# Patient Record
Sex: Male | Born: 1990 | Race: Black or African American | Hispanic: No | Marital: Single | Smoking: Never smoker
Health system: Southern US, Community
[De-identification: ages and names within clinical notes are randomized; demographics above are authoritative.]

## PROBLEM LIST (undated history)

## (undated) HISTORY — PX: TYMPANOSTOMY TUBE PLACEMENT: SHX32

---
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
## 2015-08-21 ENCOUNTER — Emergency Department (HOSPITAL_COMMUNITY)
Admission: EM | Admit: 2015-08-21 | Discharge: 2015-08-21 | Disposition: A | Discharge status: Home / Self Care | Payer: 59 | Attending: Emergency Medicine | Admitting: Emergency Medicine

## 2015-08-21 ENCOUNTER — Encounter (HOSPITAL_COMMUNITY): Payer: Self-pay | Admitting: *Deleted

## 2015-08-21 ENCOUNTER — Emergency Department (HOSPITAL_COMMUNITY): Payer: 59

## 2015-08-21 DIAGNOSIS — Y9372 Activity, wrestling: Secondary | ICD-10-CM | POA: Diagnosis not present

## 2015-08-21 DIAGNOSIS — Y999 Unspecified external cause status: Secondary | ICD-10-CM | POA: Insufficient documentation

## 2015-08-21 DIAGNOSIS — Y929 Unspecified place or not applicable: Secondary | ICD-10-CM | POA: Insufficient documentation

## 2015-08-21 DIAGNOSIS — T1490XA Injury, unspecified, initial encounter: Secondary | ICD-10-CM

## 2015-08-21 DIAGNOSIS — S3992XA Unspecified injury of lower back, initial encounter: Secondary | ICD-10-CM | POA: Diagnosis present

## 2015-08-21 DIAGNOSIS — S300XXA Contusion of lower back and pelvis, initial encounter: Secondary | ICD-10-CM | POA: Insufficient documentation

## 2015-08-21 DIAGNOSIS — W228XXA Striking against or struck by other objects, initial encounter: Secondary | ICD-10-CM | POA: Diagnosis not present

## 2015-08-21 MED ORDER — CYCLOBENZAPRINE HCL 10 MG PO TABS: 10.0000 mg | ORAL_TABLET | Freq: Two times a day (BID) | ORAL | 0 refills | Status: DC | PRN

## 2015-08-21 MED ORDER — IBUPROFEN 800 MG PO TABS: 800.0000 mg | ORAL_TABLET | Freq: Three times a day (TID) | ORAL | 0 refills | Status: DC

## 2015-08-21 NOTE — ED Triage Notes (Signed)
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 Pt reports lower back pain to tail bone.

## 2015-08-21 NOTE — ED Notes (Signed)
Declined W/C at D/C and was escorted to lobby by RN. 

## 2015-08-21 NOTE — ED Provider Notes (Signed)
[facility]-EMERGENCY DEPT Provider Note   CSN: [id number] Arrival date & time: 08/21/15  [id number]  First Provider Contact:  None       History   Chief Complaint Chief Complaint  Patient presents with  . Back Pain    HPI Allen Branch is a 25 y.o. male.  HPI   25 year old male presents for evaluation of low back pain. 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 He denies any significant pain at that time, was able to continue with this for routine but afterward he complains of sharp achy pain to his buttock most specifically at the coccyx region. Pain is more intense with ambulating but currently rates as 4 out of 10 while sitting and laying. He did take 2 ibuprofen early this morning which provided some relief. He denies any associated fever, rectal pain, blood per rectum, leg pain, radicular pain, new numbness weakness, bowel bladder incontinence or saddle anesthesia. he initially thought it was low back pain but after massaging his back he felt the pain is located in the coccyx region. He denies any prior injury to that same area.  History reviewed. No pertinent past medical history.  There are no active problems to display for this patient.   History reviewed. No pertinent surgical history.     Home Medications    Prior to Admission medications   Not on File    Family History History reviewed. No pertinent family history.  Social History Social History  Substance Use Topics  . Smoking status: Never Smoker  . Smokeless tobacco: Never Used  . Alcohol use No     Allergies   Review of patient's allergies indicates no known allergies.   Review of Systems Review of Systems  Constitutional: Negative for fever.  Gastrointestinal: Negative for blood in stool and rectal pain.  Musculoskeletal: Negative for back pain.  Neurological: Negative for numbness.      Physical Exam Updated Vital Signs BP 124/63 (BP Location: Left Arm)   Pulse 67   Temp 98.2 F (36.8 C) (Oral)   Resp 20   Ht 6' (1.829 m)   Wt 97.5 kg   SpO2 98%   BMI 29.16 kg/m   Physical Exam  Constitutional: He appears well-developed and well-nourished. No distress.  HENT:  Head: Atraumatic.  Eyes: Conjunctivae are normal.  Neck: Normal range of motion. Neck supple.  Cardiovascular: Intact distal pulses.   Musculoskeletal: He exhibits tenderness (Tenderness to the sacrum and coccyx region on palpation without any obvious deformity. No overlying skin bruises.).  No significant midline spine tenderness crepitus or step-off.  Neurological: He is alert.  Patellar deep tendon reflex intact bilaterally, no foot drops, able to ambulate  Skin: No rash noted.  Psychiatric: He has a normal mood and affect.     ED Treatments / Results  Labs (all labs ordered are listed, but only abnormal results are displayed) Labs Reviewed - No data to display  EKG  EKG Interpretation None       Radiology Dg Sacrum/coccyx  Result Date: 08/21/2015 CLINICAL DATA:  Fall last night with pain in the sacrococcygeal region. EXAM: SACRUM AND COCCYX - 2+ VIEW COMPARISON:  None. FINDINGS: The arcuate lines appear continuous. Articular space in the hips preserved. No cortical discontinuity in the pelvic ring identified. No sacral or coccygeal fracture is observed on today's conventional radiographs. IMPRESSION: 1. No acute bony  findings. If there is a high clinical suspicion of occult fracture, CT or MRI could be utilized for further characterization. Electronically Signed   By: Gaylyn Rong M.D.   On: 08/21/2015 08:26    Procedures Procedures (including critical care time)  Medications Ordered in ED Medications - No data to display   Initial Impression / Assessment and Plan / ED Course  I have reviewed the triage vital signs and the nursing notes.  Pertinent labs & imaging  results that were available during my care of the patient were reviewed by me and considered in my medical decision making (see chart for details).  Clinical Course    BP 124/63 (BP Location: Left Arm)   Pulse 67   Temp 98.2 F (36.8 C) (Oral)   Resp 20   Ht 6' (1.829 m)   Wt 97.5 kg   SpO2 98%   BMI 29.16 kg/m    Final Clinical Impressions(s) / ED Diagnoses   Final diagnoses:  Injury  Coccygeal contusion, initial encounter    New Prescriptions New Prescriptions   No medications on file   7:53 AM Patient presents with mechanical injury to his buttock region after wrestling. He is neurovascularly intact, able to ambulate, and has minimal reproducible pain. X-ray ordered to rule out fractures or dislocation.  8:42 AM Sacrum and coccyx x-ray without acute fracture. Suspect coccygeal contusion. Recommend patient to sit on a donut pillow for comfort, rice therapy discussed. NSAIDs and muscle relaxant prescribed. Orthopedic referral given as needed.   Fayrene Helper, PA-C 08/22/15 1610    Derwood Kaplan, MD 09/01/15 [phone]

## 2015-12-19 ENCOUNTER — Emergency Department
Admission: EM | Admit: 2015-12-19 | Discharge: 2015-12-19 | Disposition: A | Discharge status: Home / Self Care | Payer: 59 | Attending: Emergency Medicine | Admitting: Emergency Medicine

## 2015-12-19 ENCOUNTER — Encounter: Payer: Self-pay | Admitting: Emergency Medicine

## 2015-12-19 DIAGNOSIS — K602 Anal fissure, unspecified: Secondary | ICD-10-CM | POA: Insufficient documentation

## 2015-12-19 DIAGNOSIS — K6289 Other specified diseases of anus and rectum: Secondary | ICD-10-CM | POA: Diagnosis present

## 2015-12-19 DIAGNOSIS — Z791 Long term (current) use of non-steroidal anti-inflammatories (NSAID): Secondary | ICD-10-CM | POA: Insufficient documentation

## 2015-12-19 MED ORDER — HYDROCORTISONE ACE-PRAMOXINE 1-1 % RE FOAM
1.0000 | Freq: Two times a day (BID) | RECTAL | Status: DC
  Administered 2015-12-19: 1 via RECTAL
  Filled 2015-12-19: qty 10

## 2015-12-19 NOTE — ED Notes (Signed)
Discharge instructions reviewed with patient. Questions fielded by this RN. Patient verbalizes understanding of instructions. Patient discharged home in stable condition per Sung MD . No acute distress noted at time of discharge.   

## 2015-12-19 NOTE — ED Provider Notes (Signed)
[facility] Emergency Department Provider Note   ____________________________________________   First MD Initiated Contact with Patient 12/19/15 0401     (approximate)  I have reviewed the triage vital signs and the nursing notes.   HISTORY  Chief Complaint Rectal Pain    HPI Allen Branch is a 25 y.o. male who presents to the ED from home with a chief complaint of rectal pain. Patient noted pain to his rectum yesterday after straining to have bowel movements. He thinks he might be having a hemorrhoid. Denies associated fever, chills, chest pain, shortness of breath, abdominal pain, nausea, vomiting, rectal bleeding. Denies foreign body insertion. Denies recent travel trauma. Nothing makes his symptoms better or worse.   Past medical history None  There are no active problems to display for this patient.   Past Surgical History:  Procedure Laterality Date  . TYMPANOSTOMY TUBE PLACEMENT      Prior to Admission medications   Medication Sig Start Date End Date Taking? Authorizing Provider  cyclobenzaprine (FLEXERIL) 10 MG tablet Take 1 tablet (10 mg total) by mouth 2 (two) times daily as needed for muscle spasms. 08/21/15   Fayrene HelperBowie Tran, PA-C  ibuprofen (ADVIL,MOTRIN) 800 MG tablet Take 1 tablet (800 mg total) by mouth 3 (three) times daily. 08/21/15   Fayrene HelperBowie Tran, PA-C    Allergies Patient has no known allergies.  History reviewed. No pertinent family history.  Social History Social History  Substance Use Topics  . Smoking status: Never Smoker  . Smokeless tobacco: Never Used  . Alcohol use No    Review of Systems  Constitutional: No fever/chills. Eyes: No visual changes. ENT: No sore throat. Cardiovascular: Denies chest pain. Respiratory: Denies shortness of breath. Gastrointestinal: Positive for rectal pain. No abdominal pain.  No nausea, no vomiting.  No diarrhea.  No constipation. Genitourinary: Negative for dysuria. Musculoskeletal:  Negative for back pain. Skin: Negative for rash. Neurological: Negative for headaches, focal weakness or numbness.  10-point ROS otherwise negative.  ____________________________________________   PHYSICAL EXAM:  VITAL SIGNS: ED Triage Vitals [12/19/15 0100]  Enc Vitals Group     BP 139/86     Pulse Rate 86     Resp 18     Temp 98.3 F (36.8 C)     Temp Source Oral     SpO2 97 %     Weight 224 lb (101.6 kg)     Height 6' (1.829 m)     Head Circumference      Peak Flow      Pain Score 9     Pain Loc      Pain Edu?      Excl. in GC?     Constitutional: Alert and oriented. Well appearing and in no acute distress. Eyes: Conjunctivae are normal. PERRL. EOMI. Head: Atraumatic. Nose: No congestion/rhinnorhea. Mouth/Throat: Mucous membranes are moist.  Oropharynx non-erythematous. Neck: No stridor.   Cardiovascular: Normal rate, regular rhythm. Grossly normal heart sounds.  Good peripheral circulation. Respiratory: Normal respiratory effort.  No retractions. Lungs CTAB. Gastrointestinal: Soft and nontender to light or deep palpation.. No distention. No abdominal bruits. No CVA tenderness. Musculoskeletal: No lower extremity tenderness nor edema.  No joint effusions. Neurologic:  Normal speech and language. No gross focal neurologic deficits are appreciated. No gait instability. Skin:  Skin is warm, dry and intact. No rash noted. Psychiatric: Mood and affect are normal. Speech and behavior are normal.  ____________________________________________   LABS (all labs ordered are listed, but  only abnormal results are displayed)  Labs Reviewed - No data to display ____________________________________________  EKG  None ____________________________________________  RADIOLOGY  None ____________________________________________   PROCEDURES  Procedure(s) performed:   Rectal exam: External exam without hemorrhoids. + anal fissure. No warmth, erythema, fluctuance to  suggest perirectal abscess.  Procedures  Critical Care performed: No  ____________________________________________   INITIAL IMPRESSION / ASSESSMENT AND PLAN / ED COURSE  Pertinent labs & imaging results that were available during my care of the patient were reviewed by me and considered in my medical decision making (see chart for details).  25 year old male presenting with rectal pain secondary to anal fissure. Will apply Proctofoam, encouraged increasing fiber, using sitz baths, stool softeners and follow up with his PCP. Strict return precautions given. Patient verbalizes understanding and agrees with plan of care.  Clinical Course      ____________________________________________   FINAL CLINICAL IMPRESSION(S) / ED DIAGNOSES  Final diagnoses:  Anal fissure  Rectal pain      NEW MEDICATIONS STARTED DURING THIS VISIT:  New Prescriptions   No medications on file     Note:  This document was prepared using Dragon voice recognition software and may include unintentional dictation errors.    Irean HongJade J Sung, MD 12/19/15 [phone]

## 2015-12-19 NOTE — ED Triage Notes (Signed)
Pt presents to ED with c/o pain to his rectum since yesterday. Pt states he thinks he may have a hemorrhoid with no hx of the same. Pt denies bleeding but reports that he does have a bump at the affected area. Pt ambulatory with steady gait. No distress noted at this time.

## 2015-12-19 NOTE — Discharge Instructions (Signed)
1. Apply Proctofoam rectal cream twice daily as needed for rectal discomfort. 2. Use sitz baths several times daily. 3. Return to the ER for worsening symptoms, persistent vomiting, heavy bleeding or other concerns.

## 2016-11-06 ENCOUNTER — Emergency Department (HOSPITAL_COMMUNITY)
Admission: EM | Admit: 2016-11-06 | Discharge: 2016-11-07 | Disposition: A | Discharge status: Home / Self Care | Payer: BLUE CROSS/BLUE SHIELD | Attending: Emergency Medicine | Admitting: Emergency Medicine

## 2016-11-06 ENCOUNTER — Encounter (HOSPITAL_COMMUNITY): Payer: Self-pay | Admitting: *Deleted

## 2016-11-06 ENCOUNTER — Emergency Department (HOSPITAL_COMMUNITY): Payer: BLUE CROSS/BLUE SHIELD

## 2016-11-06 DIAGNOSIS — X500XXA Overexertion from strenuous movement or load, initial encounter: Secondary | ICD-10-CM | POA: Insufficient documentation

## 2016-11-06 DIAGNOSIS — M545 Low back pain, unspecified: Secondary | ICD-10-CM

## 2016-11-06 DIAGNOSIS — R1030 Lower abdominal pain, unspecified: Secondary | ICD-10-CM | POA: Insufficient documentation

## 2016-11-06 DIAGNOSIS — R1031 Right lower quadrant pain: Secondary | ICD-10-CM | POA: Diagnosis not present

## 2016-11-06 LAB — URINALYSIS, ROUTINE W REFLEX MICROSCOPIC
BILIRUBIN URINE: NEGATIVE
Glucose, UA: NEGATIVE mg/dL
HGB URINE DIPSTICK: NEGATIVE
KETONES UR: NEGATIVE mg/dL
Leukocytes, UA: NEGATIVE
NITRITE: NEGATIVE
PH: 6 (ref 5.0–8.0)
Protein, ur: NEGATIVE mg/dL
Specific Gravity, Urine: 1.016 (ref 1.005–1.030)

## 2016-11-06 LAB — CBC WITH DIFFERENTIAL/PLATELET
BASOS PCT: 0 %
Basophils Absolute: 0 10*3/uL (ref 0.0–0.1)
EOS ABS: 0.3 10*3/uL (ref 0.0–0.7)
Eosinophils Relative: 6 %
HEMATOCRIT: 43.3 % (ref 39.0–52.0)
Hemoglobin: 14.3 g/dL (ref 13.0–17.0)
LYMPHS ABS: 2 10*3/uL (ref 0.7–4.0)
Lymphocytes Relative: 40 %
MCH: 31 pg (ref 26.0–34.0)
MCHC: 33 g/dL (ref 30.0–36.0)
MCV: 93.9 fL (ref 78.0–100.0)
MONO ABS: 0.6 10*3/uL (ref 0.1–1.0)
MONOS PCT: 11 %
Neutro Abs: 2.2 10*3/uL (ref 1.7–7.7)
Neutrophils Relative %: 43 %
Platelets: 185 10*3/uL (ref 150–400)
RBC: 4.61 MIL/uL (ref 4.22–5.81)
RDW: 12.4 % (ref 11.5–15.5)
WBC: 5 10*3/uL (ref 4.0–10.5)

## 2016-11-06 LAB — COMPREHENSIVE METABOLIC PANEL
ALBUMIN: 4.1 g/dL (ref 3.5–5.0)
ALK PHOS: 60 U/L (ref 38–126)
ALT: 17 U/L (ref 17–63)
AST: 21 U/L (ref 15–41)
Anion gap: 8 (ref 5–15)
BUN: 13 mg/dL (ref 6–20)
CALCIUM: 9.2 mg/dL (ref 8.9–10.3)
CO2: 30 mmol/L (ref 22–32)
CREATININE: 1.16 mg/dL (ref 0.61–1.24)
Chloride: 102 mmol/L (ref 101–111)
GFR calc Af Amer: >60 mL/min (ref >60)
GFR calc non Af Amer: >60 mL/min (ref >60)
GLUCOSE: 86 mg/dL (ref 65–99)
Potassium: 4 mmol/L (ref 3.5–5.1)
SODIUM: 140 mmol/L (ref 135–145)
Total Bilirubin: 0.5 mg/dL (ref 0.3–1.2)
Total Protein: 7.5 g/dL (ref 6.5–8.1)

## 2016-11-06 LAB — CK: Total CK: 206 U/L (ref 49–397)

## 2016-11-06 NOTE — ED Triage Notes (Signed)
Pt reports lifting heavier weights that normal last week. Pt states he felt a "weird" feeling but continued to lift weights. Pt states he began having lower back pain, right buttocks pain, rlq pain, right groin pain.

## 2016-11-06 NOTE — ED Provider Notes (Signed)
[facility] EMERGENCY DEPARTMENT Provider Note   CSN: [id number] Arrival date & time: 11/06/16  2230  Time seen 23:05 PM   History   Chief Complaint Chief Complaint  Patient presents with  . Back Pain    HPI Allen Branch is a 26 y.o. male.  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  However he states October 18 he started doing a new type of lift that he had not done for many years.  He was dead lifting weights at 315 pounds.  He states he had immediate discomfort and he did a total of approximately 25 lifts.  He states you are supposed to use your legs to help lift however he was using his back to do the lifting.  He states in the past he has done up to 400 pounds but again that was many years ago.  He states he had immediate pulling and tightness feeling in his right lower  back and in his right buttock area.  He was awakened at 2 AM that night and had some numbness in his right back and his right right buttock.  He also states he felt like he was swollen in those areas.  He has had some intermittent dark urine but not on a regular consistent basis.  He states the following day he also started having right lower quadrant and right groin pain.  He states he either has pain in his groin or in his back and it rotates back and forth.  There is nothing he does that makes the pain hurt more, there is nothing he does makes it feel better.  He also states that the pain and the numbness are both improving.  He denies nausea or vomiting and states he is having a normal appetite.  He states he is able to walk normally however he is guarded because he is afraid of causing pain.  He denies any urinary or rectal incontinence.  His concern tonight is that he has appendicitis.  PCP none  History reviewed. No pertinent past medical history.  There are no active problems to display for this patient.   Past Surgical History:    Procedure Laterality Date  . TYMPANOSTOMY TUBE PLACEMENT         Home Medications    Prior to Admission medications   Medication Sig Start Date End Date Taking? Authorizing Provider  ibuprofen (ADVIL,MOTRIN) 200 MG tablet Take 200 mg by mouth every 6 (six) hours as needed for mild pain or moderate pain.   Yes [provider]  methocarbamol (ROBAXIN) 500 MG tablet Take 1 or 2 po Q 6hrs for muscle soreness or pain 11/07/16   Devoria Albe, MD  naproxen (NAPROSYN) 500 MG tablet Take 1 po BID with food prn pain 11/07/16   Devoria Albe, MD    Family History History reviewed. No pertinent family history.  Social History Social History  Substance Use Topics  . Smoking status: Never Smoker  . Smokeless tobacco: Never Used  . Alcohol use No   Professional wrestler  Allergies   Patient has no known allergies.   Review of Systems Review of Systems  All other systems reviewed and are negative.    Physical Exam Updated Vital Signs BP (!) 142/88   Pulse 75   Temp 98.9 F (37.2 C) (Oral)   Resp 16   Ht 6' (1.829 m)   Wt 97.1 kg (214  lb)   SpO2 100%   BMI 29.02 kg/m   Vital signs normal    Physical Exam  Constitutional: He is oriented to person, place, and time. He appears well-developed and well-nourished. No distress.  HENT:  Head: Normocephalic and atraumatic.  Right Ear: External ear normal.  Left Ear: External ear normal.  Nose: Nose normal.  Pt is laying on his right side, states he is almost falling asleep.   Eyes: Conjunctivae and EOM are normal.  Neck: Normal range of motion.  Cardiovascular: Normal rate.   Pulmonary/Chest: Effort normal. No respiratory distress.  Abdominal: Soft. Bowel sounds are normal. He exhibits no distension and no mass. There is tenderness. There is no rebound and no guarding. No hernia.    Mild tenderness  Musculoskeletal: Normal range of motion. He exhibits tenderness. He exhibits no edema or deformity.  Patient is  nontender in his thoracic or lumbar spine.  He has some mild tenderness of the right lateral paraspinous muscles of the lumbar spine.  He does not have pain in the sciatic notch on either side.  His patellar reflexes are 2+ and equal bilaterally, he has no pain on straight leg raising bilaterally.  Neurological: He is alert and oriented to person, place, and time. He displays normal reflexes. No cranial nerve deficit. Coordination normal.  Skin: Skin is warm and dry. No erythema.  Psychiatric: He has a normal mood and affect. His behavior is normal. Thought content normal.  Nursing note and vitals reviewed.    ED Treatments / Results  Labs (all labs ordered are listed, but only abnormal results are displayed) Results for orders placed or performed during the hospital encounter of 11/06/16  Comprehensive metabolic panel  Result Value Ref Range   Sodium 140 135 - 145 mmol/L   Potassium 4.0 3.5 - 5.1 mmol/L   Chloride 102 101 - 111 mmol/L   CO2 30 22 - 32 mmol/L   Glucose, Bld 86 65 - 99 mg/dL   BUN 13 6 - 20 mg/dL   Creatinine, Ser 4.091.16 0.61 - 1.24 mg/dL   Calcium 9.2 8.9 - 81.110.3 mg/dL   Total Protein 7.5 6.5 - 8.1 g/dL   Albumin 4.1 3.5 - 5.0 g/dL   AST 21 15 - 41 U/L   ALT 17 17 - 63 U/L   Alkaline Phosphatase 60 38 - 126 U/L   Total Bilirubin 0.5 0.3 - 1.2 mg/dL   GFR calc non Af Amer >60 >60 mL/min   GFR calc Af Amer >60 >60 mL/min   Anion gap 8 5 - 15  CBC with Differential  Result Value Ref Range   WBC 5.0 4.0 - 10.5 K/uL   RBC 4.61 4.22 - 5.81 MIL/uL   Hemoglobin 14.3 13.0 - 17.0 g/dL   HCT 91.443.3 78.239.0 - 95.652.0 %   MCV 93.9 78.0 - 100.0 fL   MCH 31.0 26.0 - 34.0 pg   MCHC 33.0 30.0 - 36.0 g/dL   RDW 21.312.4 08.611.5 - 57.815.5 %   Platelets 185 150 - 400 K/uL   Neutrophils Relative % 43 %   Neutro Abs 2.2 1.7 - 7.7 K/uL   Lymphocytes Relative 40 %   Lymphs Abs 2.0 0.7 - 4.0 K/uL   Monocytes Relative 11 %   Monocytes Absolute 0.6 0.1 - 1.0 K/uL   Eosinophils Relative 6 %    Eosinophils Absolute 0.3 0.0 - 0.7 K/uL   Basophils Relative 0 %   Basophils Absolute 0.0 0.0 - 0.1 K/uL  Urinalysis, Routine w reflex microscopic  Result Value Ref Range   Color, Urine YELLOW YELLOW   APPearance CLEAR CLEAR   Specific Gravity, Urine 1.016 1.005 - 1.030   pH 6.0 5.0 - 8.0   Glucose, UA NEGATIVE NEGATIVE mg/dL   Hgb urine dipstick NEGATIVE NEGATIVE   Bilirubin Urine NEGATIVE NEGATIVE   Ketones, ur NEGATIVE NEGATIVE mg/dL   Protein, ur NEGATIVE NEGATIVE mg/dL   Nitrite NEGATIVE NEGATIVE   Leukocytes, UA NEGATIVE NEGATIVE  CK  Result Value Ref Range   Total CK 206 49 - 397 U/L   Laboratory interpretation all normal   EKG  EKG Interpretation None       Radiology Ct Renal Stone Study  Result Date: 11/06/2016 CLINICAL DATA:  Low back pain right lower quadrant and groin pain EXAM: CT ABDOMEN AND PELVIS WITHOUT CONTRAST TECHNIQUE: Multidetector CT imaging of the abdomen and pelvis was performed following the standard protocol without IV contrast. COMPARISON:  None. FINDINGS: Lower chest: No acute abnormality. Hepatobiliary: Subcentimeter hypodensities in the liver too small to further characterize. No calcified gallstones or biliary dilatation Pancreas: Unremarkable. No pancreatic ductal dilatation or surrounding inflammatory changes. Spleen: Borderline to slightly enlarged at 14 cm. Adrenals/Urinary Tract: Adrenal glands are unremarkable. Kidneys are normal, without renal calculi, focal lesion, or hydronephrosis. Bladder is slightly thick-walled. Stomach/Bowel: Stomach is within normal limits. Appendix appears normal. No evidence of bowel wall thickening, distention, or inflammatory changes. Vascular/Lymphatic: No significant vascular findings are present. No enlarged abdominal or pelvic lymph nodes. Reproductive: Prostate is unremarkable. Other: No abdominal wall hernia or abnormality. No abdominopelvic ascites. Musculoskeletal: No acute or significant osseous findings.  IMPRESSION: Negative for nephrolithiasis, hydronephrosis or ureteral stone. There are no acute abnormalities visualized. Electronically Signed   By: Jasmine Pang M.D.   On: 11/06/2016 23:52    Procedures Procedures (including critical care time)  Medications Ordered in ED Medications - No data to display   Initial Impression / Assessment and Plan / ED Course  I have reviewed the triage vital signs and the nursing notes.  Pertinent labs & imaging results that were available during my care of the patient were reviewed by me and considered in my medical decision making (see chart for details).     My concern after talking to him is that he could have rhabdomyolysis, lab work was done to look for renal insufficiency or elevated CK.  The patient's concern is that he has appendicitis, his symptoms are very atypical however CT scan was done to make sure he did not have atypical presentation.  Hopefully he just has some sore muscles from over doing a new exercise.  PT was given his test results. He feels relieved.  We discussed his discharge instructions.  Final Clinical Impressions(s) / ED Diagnoses   Final diagnoses:  Acute right-sided low back pain without sciatica    New Prescriptions New Prescriptions   METHOCARBAMOL (ROBAXIN) 500 MG TABLET    Take 1 or 2 po Q 6hrs for muscle soreness or pain   NAPROXEN (NAPROSYN) 500 MG TABLET    Take 1 po BID with food prn pain    Plan discharge  Devoria Albe, MD, Concha Pyo, MD 11/07/16 [phone]

## 2016-11-07 MED ORDER — METHOCARBAMOL 500 MG PO TABS: ORAL_TABLET | ORAL | 0 refills | Status: DC

## 2016-11-07 MED ORDER — NAPROXEN 500 MG PO TABS: ORAL_TABLET | ORAL | 0 refills | Status: DC

## 2016-11-07 NOTE — Discharge Instructions (Signed)
Use ice and heat for comfort. Take the medications as needed for muscle soreness. Recheck if you start getting worse instead of better. TAKE IT EASY WHEN YOU START A NEW EXERCISE!!!!

## 2017-03-05 IMAGING — DX DG SACRUM/COCCYX 2+V
2 series · 2 of 2 positions shown · non-contrast
Comparison: None.

CLINICAL DATA: Fall last night with pain in the sacrococcygeal
region.

EXAM:
SACRUM AND COCCYX - 2+ VIEW

[t sacrum ap]
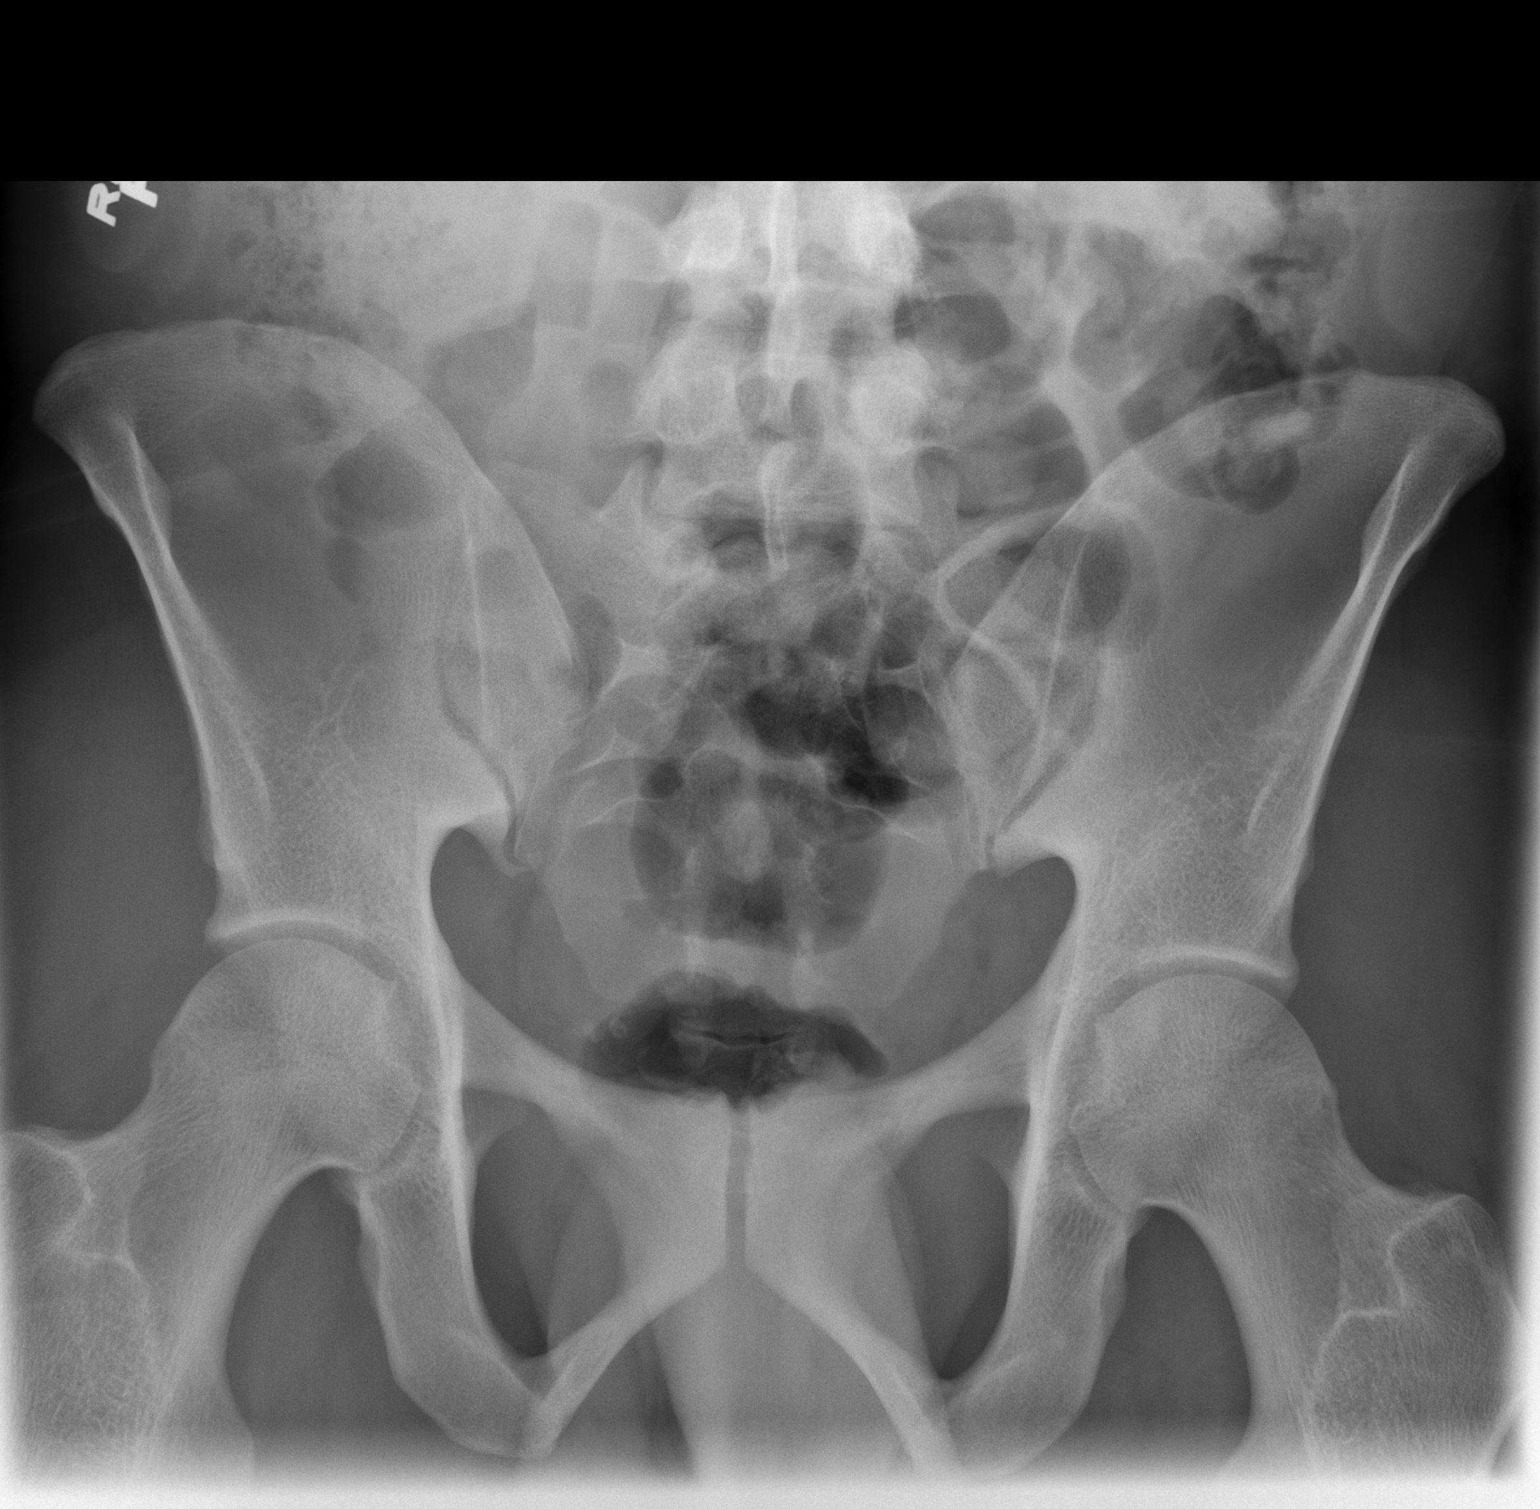

[t sacrum coccyx lat]
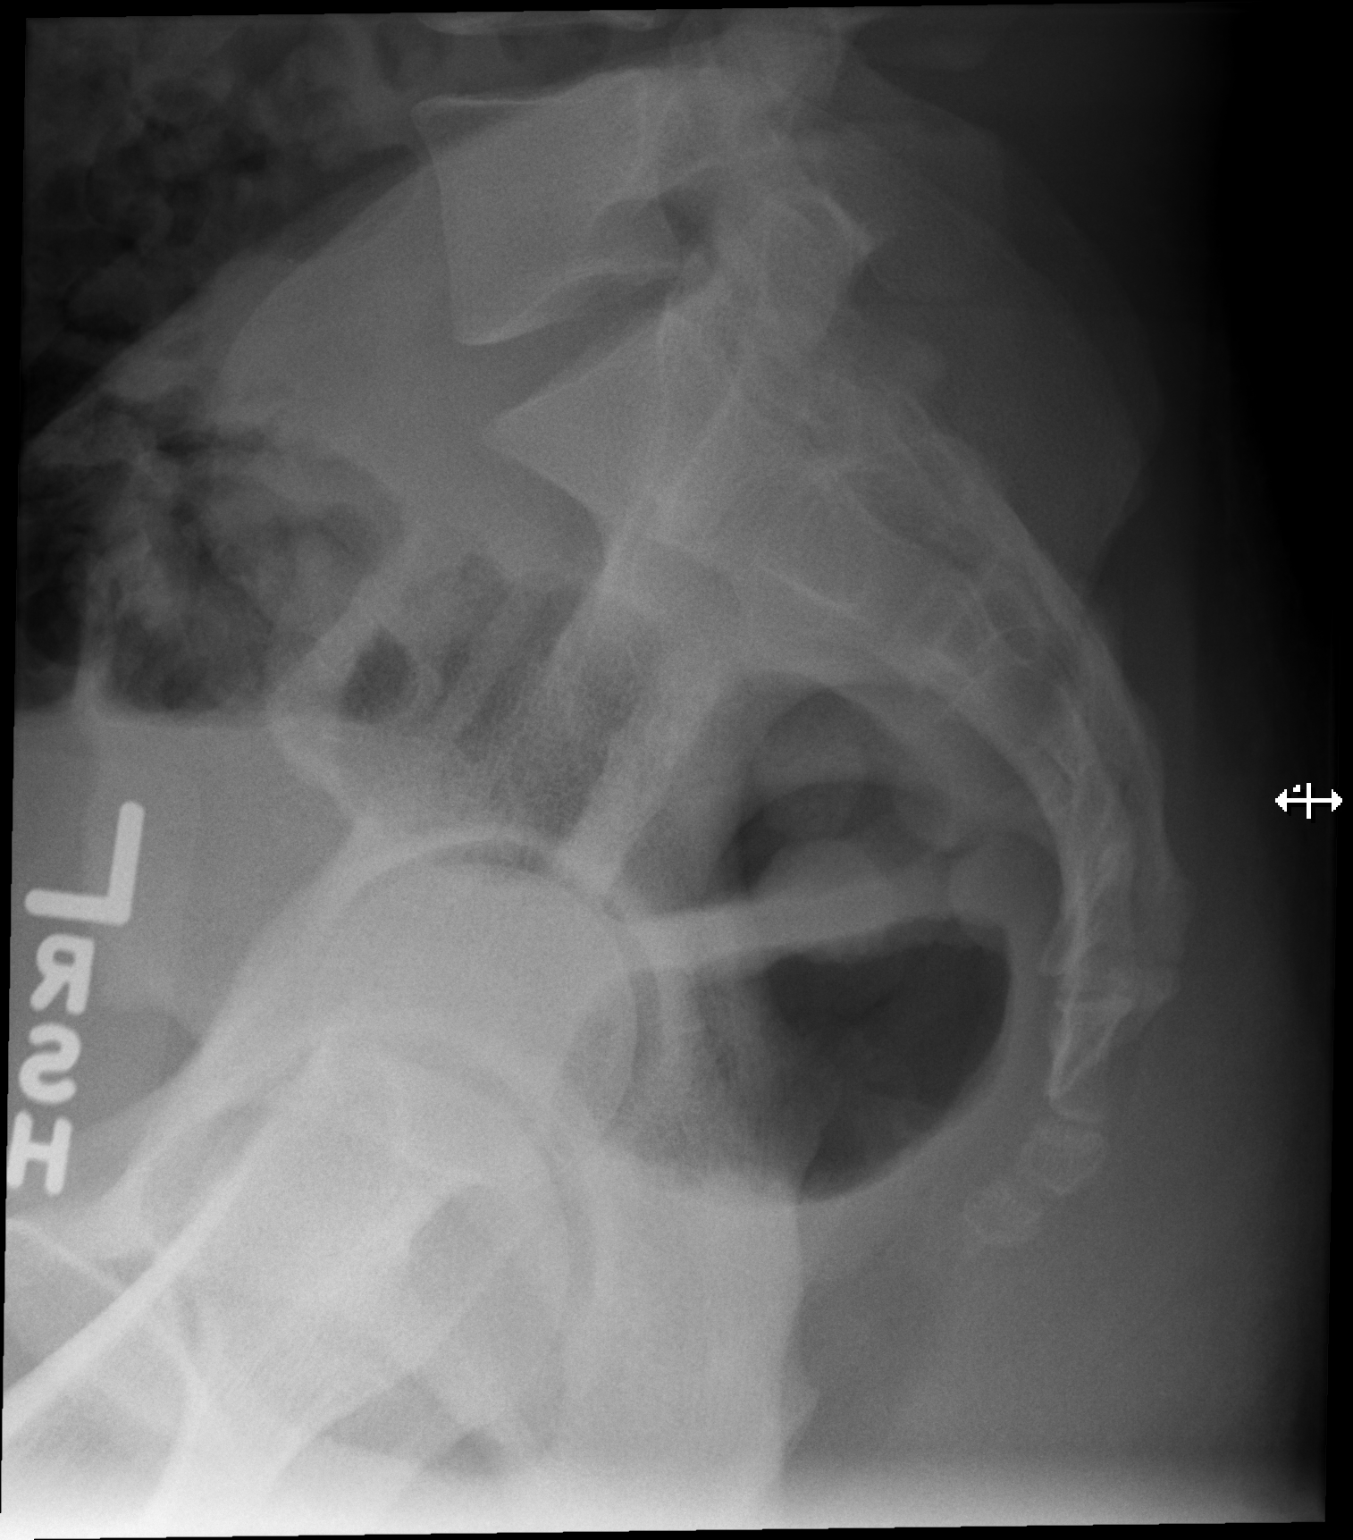

[2 of 2 positions shown; findings below may reference images not displayed]

FINDINGS: The arcuate lines appear continuous. Articular space in the hips
preserved. No cortical discontinuity in the pelvic ring identified.
No sacral or coccygeal fracture is observed on today's conventional
radiographs..
IMPRESSION: 1. No acute bony findings. If there is a high clinical suspicion of
occult fracture, CT or MRI could be utilized for further
characterization.

## 2018-12-28 ENCOUNTER — Ambulatory Visit
Admission: EM | Admit: 2018-12-28 | Discharge: 2018-12-28 | Disposition: A | Discharge status: Home / Self Care | Payer: PRIVATE HEALTH INSURANCE | Attending: Emergency Medicine | Admitting: Emergency Medicine

## 2018-12-28 ENCOUNTER — Other Ambulatory Visit: Payer: Self-pay

## 2018-12-28 DIAGNOSIS — Z20828 Contact with and (suspected) exposure to other viral communicable diseases: Secondary | ICD-10-CM | POA: Diagnosis not present

## 2018-12-28 DIAGNOSIS — Z20822 Contact with and (suspected) exposure to covid-19: Secondary | ICD-10-CM

## 2018-12-28 NOTE — Discharge Instructions (Addendum)

## 2018-12-28 NOTE — ED Provider Notes (Signed)
[facility]    CSN: [id number] Arrival date & time: 12/28/18  1740      History   Chief Complaint No chief complaint on file.   HPI Allen Branch is a 28 y.o. male.   Allen Branch 20 y old male  presents for COVID testing after COVID-19 exposure on Thursday.  Denies sick exposure to flu or strep.  Denies recent travel.  Denies aggravating or alleviating symptoms.  Denies previous COVID infection.   Denies fever, chills, fatigue, nasal congestion, rhinorrhea, sore throat, cough, SOB, wheezing, chest pain, nausea, vomiting, changes in bowel or bladder habits.    The history is provided by the patient. No language interpreter was used.    History reviewed. No pertinent past medical history.  There are no problems to display for this patient.   Past Surgical History:  Procedure Laterality Date  . TYMPANOSTOMY TUBE PLACEMENT         Home Medications    Prior to Admission medications   Medication Sig Start Date End Date Taking? Authorizing Provider  ibuprofen (ADVIL,MOTRIN) 200 MG tablet Take 200 mg by mouth every 6 (six) hours as needed for mild pain or moderate pain.    [provider]  methocarbamol (ROBAXIN) 500 MG tablet Take 1 or 2 po Q 6hrs for muscle soreness or pain 11/07/16   Allen Albe, MD  naproxen (NAPROSYN) 500 MG tablet Take 1 po BID with food prn pain 11/07/16   Allen Albe, MD    Family History Family History  Problem Relation Age of Onset  . Hypertension Mother     Social History Social History   Tobacco Use  . Smoking status: Never Smoker  . Smokeless tobacco: Never Used  Substance Use Topics  . Alcohol use: No  . Drug use: No     Allergies   Patient has no known allergies.   Review of Systems Review of Systems  Constitutional: Negative.   HENT: Negative.   Respiratory: Negative.   Cardiovascular: Negative.   Gastrointestinal: Negative.   Neurological: Negative.      Physical Exam Triage Vital Signs ED  Triage Vitals  Enc Vitals Group     BP      Pulse      Resp      Temp      Temp src      SpO2      Weight      Height      Head Circumference      Peak Flow      Pain Score      Pain Loc      Pain Edu?      Excl. in [place]?    No data found.  Updated Vital Signs BP 130/80   Pulse 65   Temp 98.5 F (36.9 C)   Resp 18   SpO2 98%   Visual Acuity Right Eye Distance:   Left Eye Distance:   Bilateral Distance:    Right Eye Near:   Left Eye Near:    Bilateral Near:     Physical Exam Vitals and nursing note reviewed.  Constitutional:      General: He is not in acute distress.    Appearance: Normal appearance. He is normal weight. He is not ill-appearing or toxic-appearing.  HENT:     Head: Normocephalic.     Right Ear: Tympanic membrane, ear canal and external ear normal. There is no impacted cerumen.     Left Ear:  Ear canal and external ear normal. There is no impacted cerumen.     Nose: Nose normal. No congestion.     Mouth/Throat:     Mouth: Mucous membranes are moist.     Pharynx: No oropharyngeal exudate or posterior oropharyngeal erythema.  Cardiovascular:     Rate and Rhythm: Normal rate and regular rhythm.     Pulses: Normal pulses.     Heart sounds: Normal heart sounds. No murmur.  Pulmonary:     Effort: Pulmonary effort is normal. No respiratory distress.     Breath sounds: No wheezing or rhonchi.  Chest:     Chest wall: No tenderness.  Abdominal:     General: Abdomen is flat. Bowel sounds are normal. There is no distension.     Palpations: There is no mass.  Skin:    Capillary Refill: Capillary refill takes less than 2 seconds.  Neurological:     Mental Status: He is alert and oriented to person, place, and time.      UC Treatments / Results  Labs (all labs ordered are listed, but only abnormal results are displayed) Labs Reviewed  NOVEL CORONAVIRUS, NAA    EKG   Radiology No results found.  Procedures Procedures (including critical  care time)  Medications Ordered in UC Medications - No data to display  Initial Impression / Assessment and Plan / UC Course  I have reviewed the triage vital signs and the nursing notes.  Pertinent labs & imaging results that were available during my care of the patient were reviewed by me and considered in my medical decision making (see chart for details).     Patient stable for discharge.  Benign physical exam.  Covid test was ordered.  Will call patient if result is abnormal. Final Clinical Impressions(s) / UC Diagnoses   Final diagnoses:  Suspected COVID-19 virus infection     Discharge Instructions     COVID testing ordered.  It will take between 2-7 days for test results.  Someone will contact you regarding abnormal results.    In the meantime: You should remain isolated in your home for 10 days from symptom onset Get plenty of rest and push fluids Tessalon Perles prescribed for cough Zyrtec-D prescribed for nasal congestion, runny nose, and/or sore throat Flonase prescribed for nasal congestion and runny nose Use medications daily for symptom relief Use OTC medications like ibuprofen or tylenol as needed fever or pain Call or go to the ED if you have any new or worsening symptoms such as fever, worsening cough, shortness of breath, chest tightness, chest pain, turning blue, changes in mental status, etc...     ED Prescriptions    None     PDMP not reviewed this encounter.   Allen Branch, [place] 12/28/18 1807

## 2018-12-28 NOTE — ED Triage Notes (Signed)
Pt here for covid testing after positive exposure on Thursday. No symptom

## 2018-12-30 LAB — NOVEL CORONAVIRUS, NAA: SARS-CoV-2, NAA: NOT DETECTED

## 2019-01-22 ENCOUNTER — Other Ambulatory Visit: Payer: Self-pay

## 2019-01-22 ENCOUNTER — Ambulatory Visit
Admission: EM | Admit: 2019-01-22 | Discharge: 2019-01-22 | Disposition: A | Discharge status: Home / Self Care | Payer: PRIVATE HEALTH INSURANCE | Attending: Emergency Medicine | Admitting: Emergency Medicine

## 2019-01-22 DIAGNOSIS — Z0189 Encounter for other specified special examinations: Secondary | ICD-10-CM | POA: Diagnosis not present

## 2019-01-22 DIAGNOSIS — Z20822 Contact with and (suspected) exposure to covid-19: Secondary | ICD-10-CM

## 2019-01-22 NOTE — Discharge Instructions (Signed)

## 2019-01-22 NOTE — ED Provider Notes (Signed)
[facility]   [id number] 01/22/19 Arrival Time: 1141   CC: COVID test; exposure  SUBJECTIVE: History from: patient.  Allen Branch is a 29 y.o. male who presents for COVID testing.  COVID exposure 5 days ago at work.  Denies recent travel.  Denies aggravating or alleviating symptoms.  Denies previous COVID infection.   Denies fever, chills, fatigue, nasal congestion, rhinorrhea, sore throat, cough, SOB, wheezing, chest pain, nausea, vomiting, changes in bowel or bladder habits.    ROS: As per HPI.  All other pertinent ROS negative.     History reviewed. No pertinent past medical history. Past Surgical History:  Procedure Laterality Date  . TYMPANOSTOMY TUBE PLACEMENT     No Known Allergies No current facility-administered medications on file prior to encounter.   No current outpatient medications on file prior to encounter.   Social History   Socioeconomic History  . Marital status: Single    Spouse name: Not on file  . Number of children: Not on file  . Years of education: Not on file  . Highest education level: Not on file  Occupational History  . Not on file  Tobacco Use  . Smoking status: Never Smoker  . Smokeless tobacco: Never Used  Substance and Sexual Activity  . Alcohol use: No  . Drug use: No  . Sexual activity: Not on file  Other Topics Concern  . Not on file  Social History Narrative  . Not on file   Social Determinants of Health   Financial Resource Strain:   . Difficulty of Paying Living Expenses: Not on file  Food Insecurity:   . Worried About Programme researcher, broadcasting/film/video in the Last Year: Not on file  . Ran Out of Food in the Last Year: Not on file  Transportation Needs:   . Lack of Transportation (Medical): Not on file  . Lack of Transportation (Non-Medical): Not on file  Physical Activity:   . Days of Exercise per Week: Not on file  . Minutes of Exercise per Session: Not on file  Stress:   . Feeling of Stress : Not on file  Social  Connections:   . Frequency of Communication with Friends and Family: Not on file  . Frequency of Social Gatherings with Friends and Family: Not on file  . Attends Religious Services: Not on file  . Active Member of Clubs or Organizations: Not on file  . Attends Banker Meetings: Not on file  . Marital Status: Not on file  Intimate Partner Violence:   . Fear of Current or Ex-Partner: Not on file  . Emotionally Abused: Not on file  . Physically Abused: Not on file  . Sexually Abused: Not on file   Family History  Problem Relation Age of Onset  . Hypertension Mother   . Healthy Father     OBJECTIVE:  Vitals:   01/22/19 1207  BP: 115/78  Pulse: 78  Resp: 16  Temp: 97.8 F (36.6 C)  TempSrc: Oral  SpO2: 96%     General appearance: alert; well-appearing, nontoxic; speaking in full sentences and tolerating own secretions HEENT: NCAT; Ears: EACs clear, TMs pearly gray; Eyes: PERRL.  EOM grossly intact. Nose: nares patent without rhinorrhea, Throat: oropharynx clear, tonsils non erythematous or enlarged, uvula midline  Neck: supple without LAD Lungs: unlabored respirations, symmetrical air entry; cough: absent; no respiratory distress; CTAB Heart: regular rate and rhythm.   Skin: warm and dry Psychological: alert and cooperative; normal mood and affect  ASSESSMENT & PLAN:  1. Patient request for diagnostic testing   2. Exposure to COVID-19 virus     COVID testing ordered.  It will take between 5-7 days for test results.  Someone will contact you regarding abnormal results.    In the meantime: You should remain isolated in your home for 10 days from symptom onset AND greater than 72 hours after symptoms resolution (absence of fever without the use of fever-reducing medication and improvement in respiratory symptoms), whichever is longer OR 14 days from exposure Get plenty of rest and push fluids Use OTC zyrtec for nasal congestion, runny nose, and/or sore  throat Use OTC flonase for nasal congestion and runny nose Use medications daily for symptom relief Use OTC medications like ibuprofen or tylenol as needed fever or pain Call or go to the ED if you have any new or worsening symptoms such as fever, cough, shortness of breath, chest tightness, chest pain, turning blue, changes in mental status, etc...   Reviewed expectations re: course of current medical issues. Questions answered. Outlined signs and symptoms indicating need for more acute intervention. Patient verbalized understanding. After Visit Summary given.         Lestine Box, PA-C 01/22/19 1258

## 2019-01-22 NOTE — ED Triage Notes (Signed)
Pt presents with complaints of being exposed to covid 5 days ago and is requesting a covid test. Pt does not have any symptoms at this time.

## 2019-01-24 LAB — NOVEL CORONAVIRUS, NAA

## 2019-02-19 ENCOUNTER — Other Ambulatory Visit: Payer: Self-pay

## 2019-02-19 ENCOUNTER — Ambulatory Visit
Admission: EM | Admit: 2019-02-19 | Discharge: 2019-02-19 | Disposition: A | Discharge status: Home / Self Care | Payer: PRIVATE HEALTH INSURANCE | Attending: Emergency Medicine | Admitting: Emergency Medicine

## 2019-02-19 DIAGNOSIS — Z20822 Contact with and (suspected) exposure to covid-19: Secondary | ICD-10-CM

## 2019-02-19 NOTE — ED Provider Notes (Signed)
[facility]   [id number] 02/19/19 Arrival Time: 0092   CC: COVID symptoms  SUBJECTIVE: History from: patient.  Allen Branch is a 29 y.o. male who presents with exposure to Covid 19 at work. Pt is a Arts development officer. Denies fever, chills, fatigue, sinus pain, rhinorrhea, sore throat, cough, headache, body aches, SOB, wheezing, chest pain, nausea, changes in bowel or bladder habits.    Denies fever, chills, fatigue, otalgia, sinus pain, nasal congestion, rhinorrhea, sore throat, cough, SOB, wheezing, chest pain, nausea, changes in bowel or bladder habits.     Received flu shot this year: no.  ROS: As per HPI.  All other pertinent ROS negative.     No past medical history on file. Past Surgical History:  Procedure Laterality Date  . TYMPANOSTOMY TUBE PLACEMENT     No Known Allergies No current facility-administered medications on file prior to encounter.   No current outpatient medications on file prior to encounter.   Social History   Socioeconomic History  . Marital status: Single    Spouse name: Not on file  . Number of children: Not on file  . Years of education: Not on file  . Highest education level: Not on file  Occupational History  . Not on file  Tobacco Use  . Smoking status: Never Smoker  . Smokeless tobacco: Never Used  Substance and Sexual Activity  . Alcohol use: No  . Drug use: No  . Sexual activity: Not on file  Other Topics Concern  . Not on file  Social History Narrative  . Not on file   Social Determinants of Health   Financial Resource Strain:   . Difficulty of Paying Living Expenses: Not on file  Food Insecurity:   . Worried About Charity fundraiser in the Last Year: Not on file  . Ran Out of Food in the Last Year: Not on file  Transportation Needs:   . Lack of Transportation (Medical): Not on file  . Lack of Transportation (Non-Medical): Not on file  Physical Activity:   . Days of Exercise per Week: Not on file  . Minutes of  Exercise per Session: Not on file  Stress:   . Feeling of Stress : Not on file  Social Connections:   . Frequency of Communication with Friends and Family: Not on file  . Frequency of Social Gatherings with Friends and Family: Not on file  . Attends Religious Services: Not on file  . Active Member of Clubs or Organizations: Not on file  . Attends Archivist Meetings: Not on file  . Marital Status: Not on file  Intimate Partner Violence:   . Fear of Current or Ex-Partner: Not on file  . Emotionally Abused: Not on file  . Physically Abused: Not on file  . Sexually Abused: Not on file   Family History  Problem Relation Age of Onset  . Hypertension Mother   . Healthy Father     OBJECTIVE:  Vitals:   02/19/19 1410  BP: 124/66  Pulse: 60  Resp: 16  Temp: 97.9 F (36.6 C)  TempSrc: Oral  SpO2: 98%     General appearance: alert; appears fatigued, but nontoxic; speaking in full sentences and tolerating own secretions HEENT: NCAT; Ears: EACs clear, TMs pearly gray; Eyes: PERRL.  EOM grossly intact. Sinuses: nontender; Nose: nares patent without rhinorrhea, Throat: oropharynx clear, tonsils non erythematous or enlarged, uvula midline  Neck: supple without LAD Lungs: unlabored respirations, symmetrical air entry; cough: absent; no  respiratory distress; CTAB Heart: regular rate and rhythm.  Radial pulses 2+ symmetrical bilaterally Skin: warm and dry Psychological: alert and cooperative; normal mood and affect  LABS:  No results found for this or any previous visit (from the past 24 hour(s)).   ASSESSMENT & PLAN:  1. Exposure to COVID-19 virus     No orders of the defined types were placed in this encounter.     COVID testing ordered.  It will take between 5-7 days for test results.  Someone will contact you regarding abnormal results.    In the meantime: You should remain isolated in your home for 10 days from symptom onset AND greater than 72 hours after  symptoms resolution (absence of fever without the use of fever-reducing medication and improvement in respiratory symptoms), whichever is longer Get plenty of rest and push fluids Tessalon Perles prescribed for cough Use OTC zyrtec for nasal congestion, runny nose, and/or sore throat Use OTC flonase for nasal congestion and runny nose Use medications daily for symptom relief Use OTC medications like ibuprofen or tylenol as needed fever or pain Call or go to the ED if you have any new or worsening symptoms such as fever, worsening cough, shortness of breath, chest tightness, chest pain, turning blue, changes in mental status, etc...   Reviewed expectations re: course of current medical issues. Questions answered. Outlined signs and symptoms indicating need for more acute intervention. Patient verbalized understanding. After Visit Summary given.         Moshe Cipro, NP 02/19/19 1422

## 2019-02-19 NOTE — Discharge Instructions (Addendum)
Your COVID test is pending.  You should self quarantine until your test result is back and is negative.    Take Tylenol as needed for fever or discomfort.  Rest and keep yourself hydrated.    Go to the emergency department if you develop high fever, shortness of breath, severe diarrhea, or other concerning symptoms.    

## 2019-02-19 NOTE — ED Triage Notes (Signed)
Pt presents to UC for covid test. Denies symptoms. Was exposed to covid 5 days ago.

## 2019-02-20 LAB — NOVEL CORONAVIRUS, NAA: SARS-CoV-2, NAA: NOT DETECTED

## 2019-03-13 ENCOUNTER — Other Ambulatory Visit: Payer: Self-pay

## 2019-03-13 ENCOUNTER — Ambulatory Visit
Admission: EM | Admit: 2019-03-13 | Discharge: 2019-03-13 | Disposition: A | Discharge status: Home / Self Care | Payer: PRIVATE HEALTH INSURANCE | Attending: Emergency Medicine | Admitting: Emergency Medicine

## 2019-03-13 DIAGNOSIS — Z20822 Contact with and (suspected) exposure to covid-19: Secondary | ICD-10-CM

## 2019-03-13 MED ORDER — FLUTICASONE PROPIONATE 50 MCG/ACT NA SUSP: 1.0000 | Freq: Every day | NASAL | 0 refills | Status: AC

## 2019-03-13 NOTE — ED Provider Notes (Signed)
[facility]    CSN: [id number] Arrival date & time: 03/13/19  3329      History   Chief Complaint No chief complaint on file.   HPI Allen Branch is a 29 y.o. male.   Who presented to the urgent care for complaint of nasal congestion for the past 1 day.  Reported positive Covid exposure.  Denies sick exposure to  flu or strep.  Denies recent travel.  Denies aggravating or alleviating symptoms.  Denies previous COVID infection.   Denies fever, chills, fatigue,  rhinorrhea, sore throat, cough, SOB, wheezing, chest pain, nausea, vomiting, changes in bowel or bladder habits.    The history is provided by the patient. No language interpreter was used.    History reviewed. No pertinent past medical history.  There are no problems to display for this patient.   Past Surgical History:  Procedure Laterality Date  . TYMPANOSTOMY TUBE PLACEMENT         Home Medications    Prior to Admission medications   Medication Sig Start Date End Date Taking? Authorizing Provider  fluticasone (FLONASE) 50 MCG/ACT nasal spray Place 1 spray into both nostrils daily for 14 days. 03/13/19 03/27/19  [name], FNP    Family History Family History  Problem Relation Age of Onset  . Hypertension Mother   . Healthy Father     Social History Social History   Tobacco Use  . Smoking status: Never Smoker  . Smokeless tobacco: Never Used  Substance Use Topics  . Alcohol use: No  . Drug use: No     Allergies   Patient has no known allergies.   Review of Systems Review of Systems  Constitutional: Negative.   HENT: Positive for congestion.   Respiratory: Negative.   Cardiovascular: Negative.   Gastrointestinal: Negative.   Neurological: Negative.      Physical Exam Triage Vital Signs ED Triage Vitals  Enc Vitals Group     BP 03/13/19 0912 126/78     Pulse Rate 03/13/19 0912 74     Resp 03/13/19 0912 18     Temp 03/13/19 0912 98.3 F (36.8 C)     Temp src  --      SpO2 03/13/19 0912 98 %     Weight --      Height --      Head Circumference --      Peak Flow --      Pain Score 03/13/19 0911 0     Pain Loc --      Pain Edu? --      Excl. in [place]? --    No data found.  Updated Vital Signs BP 126/78   Pulse 74   Temp 98.3 F (36.8 C)   Resp 18   SpO2 98%   Visual Acuity Right Eye Distance:   Left Eye Distance:   Bilateral Distance:    Right Eye Near:   Left Eye Near:    Bilateral Near:     Physical Exam Vitals and nursing note reviewed.  Constitutional:      General: He is not in acute distress.    Appearance: Normal appearance. He is normal weight. He is not ill-appearing or toxic-appearing.  HENT:     Head: Normocephalic.     Right Ear: Tympanic membrane, ear canal and external ear normal. There is no impacted cerumen.     Left Ear: Tympanic membrane, ear canal and external ear normal. There is no impacted  cerumen.     Nose: Nose normal. No congestion.     Mouth/Throat:     Mouth: Mucous membranes are moist.     Pharynx: Oropharynx is clear. No oropharyngeal exudate or posterior oropharyngeal erythema.  Cardiovascular:     Rate and Rhythm: Normal rate and regular rhythm.     Pulses: Normal pulses.     Heart sounds: Normal heart sounds. No murmur.  Pulmonary:     Effort: Pulmonary effort is normal. No respiratory distress.     Breath sounds: Normal breath sounds. No wheezing or rhonchi.  Chest:     Chest wall: No tenderness.  Neurological:     Mental Status: He is alert and oriented to person, place, and time.      UC Treatments / Results  Labs (all labs ordered are listed, but only abnormal results are displayed) Labs Reviewed  NOVEL CORONAVIRUS, NAA    EKG   Radiology No results found.  Procedures Procedures (including critical care time)  Medications Ordered in UC Medications - No data to display  Initial Impression / Assessment and Plan / UC Course  I have reviewed the triage vital signs and  the nursing notes.  Pertinent labs & imaging results that were available during my care of the patient were reviewed by me and considered in my medical decision making (see chart for details).      Patient is stable for discharge. COVID-19 test was ordered. Flonase was prescribed Work note was given Advised patient to quarantine Return or go to ED for worsening symptoms  Final Clinical Impressions(s) / UC Diagnoses   Final diagnoses:  Suspected COVID-19 virus infection     Discharge Instructions     COVID testing ordered.  It will take between 5-7 days for test results.  Someone will contact you regarding abnormal results.    In the meantime: You should remain isolated in your home for 10 days from symptom onset AND greater than 72 hours after symptoms resolution (absence of fever without the use of fever-reducing medication and improvement in respiratory symptoms), whichever is longer Get plenty of rest and push fluids Tessalon Perles prescribed for cough Zyrtec-D prescribed for nasal congestion, runny nose, and/or sore throat Flonase prescribed for nasal congestion and runny nose Use medications daily for symptom relief Use OTC medications like ibuprofen or tylenol as needed fever or pain Call or go to the ED if you have any new or worsening symptoms such as fever, worsening cough, shortness of breath, chest tightness, chest pain, turning blue, changes in mental status, etc...      ED Prescriptions    Medication Sig Dispense Auth. Provider   fluticasone (FLONASE) 50 MCG/ACT nasal spray Place 1 spray into both nostrils daily for 14 days. 16 g Durward Parcel, FNP     PDMP not reviewed this encounter.   Durward Parcel, FNP 03/13/19 [phone]

## 2019-03-13 NOTE — Discharge Instructions (Addendum)
COVID testing ordered.  It will take between 2-7 days for test results.  Someone will contact you regarding abnormal results.   ° °In the meantime: °You should remain isolated in your home for 10 days from symptom onset AND greater than 24hours after symptoms resolution (absence of fever without the use of fever-reducing medication and improvement in respiratory symptoms), whichever is longer °Get plenty of rest and push fluids °Flonase prescribed for nasal congestion and runny nose °Use medications daily for symptom relief °Use OTC medications like ibuprofen or tylenol as needed fever or pain °Call or go to the ED if you have any new or worsening symptoms such as fever, worsening cough, shortness of breath, chest tightness, chest pain, turning blue, changes in mental status, etc...  ° °

## 2019-03-13 NOTE — ED Triage Notes (Signed)
Pt needs covid test for work after exposure last week and developed sinus congestion yesterday

## 2019-03-14 LAB — NOVEL CORONAVIRUS, NAA: SARS-CoV-2, NAA: NOT DETECTED

## 2019-04-06 ENCOUNTER — Ambulatory Visit
Admission: EM | Admit: 2019-04-06 | Discharge: 2019-04-06 | Disposition: A | Discharge status: Home / Self Care | Payer: PRIVATE HEALTH INSURANCE

## 2019-04-06 ENCOUNTER — Other Ambulatory Visit: Payer: Self-pay

## 2019-04-06 NOTE — ED Triage Notes (Signed)
Pt presents to Urgent Care with request for COVID test after being exposed on Saturday 3/20. Per Allen Rummage NP pt educated that CDC recommendation is to wait 5 days post exposure before being tested unless they are symptomatic. Patient denies symptoms and chose to come back on Thursday for testing.

## 2019-04-06 NOTE — Discharge Instructions (Addendum)
COVID testing ordered.  It will take between 2-7 days for test results.  Someone will contact you regarding abnormal results.    In the meantime: You should remain isolated in your home for 10 days from symptom onset AND greater than 24 hours after symptoms resolution (absence of fever without the use of fever-reducing medication and improvement in respiratory symptoms), whichever is longer Get plenty of rest and push fluids Tessalon Perles prescribed for cough Zyrtec-D prescribed for nasal congestion, runny nose, and/or sore throat Flonase prescribed for nasal congestion and runny nose Use medications daily for symptom relief Use OTC medications like ibuprofen or tylenol as needed fever or pain Call or go to the ED if you have any new or worsening symptoms such as fever, worsening cough, shortness of breath, chest tightness, chest pain, turning blue, changes in mental status, etc...  

## 2019-04-07 ENCOUNTER — Other Ambulatory Visit: Payer: Self-pay

## 2019-04-07 ENCOUNTER — Ambulatory Visit
Admission: EM | Admit: 2019-04-07 | Discharge: 2019-04-07 | Disposition: A | Discharge status: Home / Self Care | Payer: PRIVATE HEALTH INSURANCE

## 2019-04-07 DIAGNOSIS — Z0189 Encounter for other specified special examinations: Secondary | ICD-10-CM

## 2019-04-07 DIAGNOSIS — Z20822 Contact with and (suspected) exposure to covid-19: Secondary | ICD-10-CM

## 2019-04-07 NOTE — Discharge Instructions (Addendum)

## 2019-04-07 NOTE — ED Triage Notes (Signed)
Pt presents with complaints of being exposed to covid on 3/20. Denies any symptoms. Would like to be tested.

## 2019-04-07 NOTE — ED Provider Notes (Addendum)
[facility]   [phone] 04/07/19 Arrival Time: 1741   CC: COVID exposure  SUBJECTIVE: History from: patient.  Allen Branch is a 29 y.o. male who presents for COVID testing.  Admits to COVID exposure 4 days ago at a wrestling tournament. Denies aggravating or alleviating symptoms.  Denies previous COVID infection.   Denies fever, chills, fatigue, nasal congestion, rhinorrhea, sore throat, cough, SOB, wheezing, chest pain, nausea, vomiting, changes in bowel or bladder habits.    ROS: As per HPI.  All other pertinent ROS negative.     History reviewed. No pertinent past medical history. Past Surgical History:  Procedure Laterality Date  . TYMPANOSTOMY TUBE PLACEMENT     No Known Allergies No current facility-administered medications on file prior to encounter.   Current Outpatient Medications on File Prior to Encounter  Medication Sig Dispense Refill  . loratadine (CLARITIN) 10 MG tablet Take 10 mg by mouth daily.    . fluticasone (FLONASE) 50 MCG/ACT nasal spray Place 1 spray into both nostrils daily for 14 days. 16 g 0   Social History   Socioeconomic History  . Marital status: Single    Spouse name: Not on file  . Number of children: Not on file  . Years of education: Not on file  . Highest education level: Not on file  Occupational History  . Not on file  Tobacco Use  . Smoking status: Never Smoker  . Smokeless tobacco: Never Used  Substance and Sexual Activity  . Alcohol use: No  . Drug use: No  . Sexual activity: Not on file  Other Topics Concern  . Not on file  Social History Narrative  . Not on file   Social Determinants of Health   Financial Resource Strain:   . Difficulty of Paying Living Expenses:   Food Insecurity:   . Worried About Programme researcher, broadcasting/film/video in the Last Year:   . Barista in the Last Year:   Transportation Needs:   . Freight forwarder (Medical):   Marland Kitchen Lack of Transportation (Non-Medical):   Physical Activity:   .  Days of Exercise per Week:   . Minutes of Exercise per Session:   Stress:   . Feeling of Stress :   Social Connections:   . Frequency of Communication with Friends and Family:   . Frequency of Social Gatherings with Friends and Family:   . Attends Religious Services:   . Active Member of Clubs or Organizations:   . Attends Banker Meetings:   Marland Kitchen Marital Status:   Intimate Partner Violence:   . Fear of Current or Ex-Partner:   . Emotionally Abused:   Marland Kitchen Physically Abused:   . Sexually Abused:    Family History  Problem Relation Age of Onset  . Hypertension Mother   . Healthy Father     OBJECTIVE:  Vitals:   04/07/19 1754  BP: 128/81  Pulse: 80  Resp: 19  Temp: 98 F (36.7 C)  SpO2: 96%    General appearance: alert; well-appearing, nontoxic; speaking in full sentences and tolerating own secretions HEENT: NCAT; Ears: EACs clear; Eyes: PERRL.  EOM grossly intact.Nose: nares patent without rhinorrhea, Throat: oropharynx clear, tonsils non erythematous or enlarged, uvula midline  Neck: supple without LAD Lungs: unlabored respirations, symmetrical air entry; cough: absent; no respiratory distress; CTAB Heart: regular rate and rhythm.  Skin: warm and dry Psychological: alert and cooperative; normal mood and affect  ASSESSMENT & PLAN:  1. Exposure  to COVID-19 virus   2. Patient request for diagnostic testing    COVID testing ordered.  It will take between 2-5 days for test results.  Someone will contact you regarding abnormal results.    In the meantime:  If you were to develop symptoms: You should remain isolated in your home for 10 days from symptom onset AND greater than 72 hours after symptoms resolution (absence of fever without the use of fever-reducing medication and improvement in respiratory symptoms), whichever is longer If you have had exposure: you should remain in quarantine for 7 days from exposure.  However, if symptoms develop you must self isolate  and return for retesting Get plenty of rest and push fluids Follow up with PCP as needed Call or go to the ED if you have any new symptoms such as fever, cough, shortness of breath, chest tightness, chest pain, turning blue, changes in mental status, etc...   Reviewed expectations re: course of current medical issues. Questions answered. Outlined signs and symptoms indicating need for more acute intervention. Patient verbalized understanding. After Visit Summary given.         Lestine Box, PA-C 04/07/19 [place], [place], PA-C 04/07/19 1832

## 2019-04-09 LAB — NOVEL CORONAVIRUS, NAA: SARS-CoV-2, NAA: NOT DETECTED

## 2019-04-09 LAB — SARS-COV-2, NAA 2 DAY TAT

## 2022-08-21 DIAGNOSIS — Z113 Encounter for screening for infections with a predominantly sexual mode of transmission: Secondary | ICD-10-CM | POA: Diagnosis not present

## 2022-08-21 DIAGNOSIS — B356 Tinea cruris: Secondary | ICD-10-CM | POA: Diagnosis not present

## 2022-08-21 DIAGNOSIS — Z114 Encounter for screening for human immunodeficiency virus [HIV]: Secondary | ICD-10-CM | POA: Diagnosis not present
